# Patient Record
Sex: Female | Born: 1975 | Race: White | Hispanic: No | Smoking: Current every day smoker
Health system: Southern US, Community
[De-identification: ages and names within clinical notes are randomized; demographics above are authoritative.]

## PROBLEM LIST (undated history)

## (undated) ENCOUNTER — Ambulatory Visit (HOSPITAL_COMMUNITY): Admission: EM | Payer: Medicaid - Out of State | Source: Home / Self Care

## (undated) DIAGNOSIS — J45909 Unspecified asthma, uncomplicated: Secondary | ICD-10-CM

## (undated) DIAGNOSIS — E119 Type 2 diabetes mellitus without complications: Secondary | ICD-10-CM

## (undated) HISTORY — PX: ABDOMINAL HYSTERECTOMY: SHX81

## (undated) HISTORY — PX: CHOLECYSTECTOMY: SHX55

---

## 1997-07-24 ENCOUNTER — Emergency Department (HOSPITAL_COMMUNITY): Admission: EM | Admit: 1997-07-24 | Discharge: 1997-07-24 | Payer: Self-pay | Admitting: Emergency Medicine

## 1997-10-27 ENCOUNTER — Emergency Department (HOSPITAL_COMMUNITY): Admission: EM | Admit: 1997-10-27 | Discharge: 1997-10-27 | Payer: Self-pay | Admitting: Emergency Medicine

## 1998-04-06 ENCOUNTER — Emergency Department (HOSPITAL_COMMUNITY): Admission: EM | Admit: 1998-04-06 | Discharge: 1998-04-06 | Payer: Self-pay | Admitting: Emergency Medicine

## 1998-06-27 ENCOUNTER — Emergency Department (HOSPITAL_COMMUNITY): Admission: EM | Admit: 1998-06-27 | Discharge: 1998-06-27 | Payer: Self-pay | Admitting: Emergency Medicine

## 1999-12-16 ENCOUNTER — Ambulatory Visit (HOSPITAL_BASED_OUTPATIENT_CLINIC_OR_DEPARTMENT_OTHER): Admission: RE | Admit: 1999-12-16 | Discharge: 1999-12-16 | Payer: Self-pay | Admitting: *Deleted

## 1999-12-24 ENCOUNTER — Emergency Department (HOSPITAL_COMMUNITY): Admission: EM | Admit: 1999-12-24 | Discharge: 1999-12-24 | Payer: Self-pay | Admitting: *Deleted

## 2000-03-09 ENCOUNTER — Emergency Department (HOSPITAL_COMMUNITY): Admission: EM | Admit: 2000-03-09 | Discharge: 2000-03-09 | Payer: Self-pay | Admitting: Emergency Medicine

## 2001-02-13 ENCOUNTER — Emergency Department (HOSPITAL_COMMUNITY): Admission: EM | Admit: 2001-02-13 | Discharge: 2001-02-13 | Payer: Self-pay | Admitting: Emergency Medicine

## 2001-02-13 ENCOUNTER — Encounter: Payer: Self-pay | Admitting: Emergency Medicine

## 2001-03-08 ENCOUNTER — Other Ambulatory Visit: Admission: RE | Admit: 2001-03-08 | Discharge: 2001-03-08 | Payer: Self-pay | Admitting: Obstetrics and Gynecology

## 2001-04-12 ENCOUNTER — Inpatient Hospital Stay (HOSPITAL_COMMUNITY): Admission: AD | Admit: 2001-04-12 | Discharge: 2001-04-12 | Payer: Self-pay | Admitting: Obstetrics and Gynecology

## 2001-05-04 ENCOUNTER — Encounter: Payer: Self-pay | Admitting: Obstetrics and Gynecology

## 2001-05-04 ENCOUNTER — Ambulatory Visit (HOSPITAL_COMMUNITY): Admission: RE | Admit: 2001-05-04 | Discharge: 2001-05-04 | Payer: Self-pay | Admitting: Obstetrics and Gynecology

## 2001-10-06 ENCOUNTER — Inpatient Hospital Stay (HOSPITAL_COMMUNITY): Admission: AD | Admit: 2001-10-06 | Discharge: 2001-10-06 | Payer: Self-pay | Admitting: Obstetrics and Gynecology

## 2001-10-07 ENCOUNTER — Inpatient Hospital Stay (HOSPITAL_COMMUNITY): Admission: AD | Admit: 2001-10-07 | Discharge: 2001-10-09 | Payer: Self-pay | Admitting: Obstetrics and Gynecology

## 2002-01-08 ENCOUNTER — Emergency Department (HOSPITAL_COMMUNITY): Admission: EM | Admit: 2002-01-08 | Discharge: 2002-01-08 | Payer: Self-pay | Admitting: Emergency Medicine

## 2002-04-02 ENCOUNTER — Encounter (INDEPENDENT_AMBULATORY_CARE_PROVIDER_SITE_OTHER): Payer: Self-pay | Admitting: *Deleted

## 2002-04-02 LAB — CONVERTED CEMR LAB

## 2002-04-13 ENCOUNTER — Other Ambulatory Visit: Admission: RE | Admit: 2002-04-13 | Discharge: 2002-04-13 | Payer: Self-pay | Admitting: Obstetrics and Gynecology

## 2002-04-23 ENCOUNTER — Encounter: Admission: RE | Admit: 2002-04-23 | Discharge: 2002-04-23 | Payer: Self-pay | Admitting: Family Medicine

## 2002-10-14 ENCOUNTER — Emergency Department (HOSPITAL_COMMUNITY): Admission: EM | Admit: 2002-10-14 | Discharge: 2002-10-14 | Payer: Self-pay | Admitting: Emergency Medicine

## 2002-11-19 ENCOUNTER — Encounter: Admission: RE | Admit: 2002-11-19 | Discharge: 2002-11-19 | Payer: Self-pay | Admitting: Family Medicine

## 2003-11-15 ENCOUNTER — Other Ambulatory Visit: Admission: RE | Admit: 2003-11-15 | Discharge: 2003-11-15 | Payer: Self-pay | Admitting: Advanced Practice Midwife

## 2003-11-20 ENCOUNTER — Ambulatory Visit: Payer: Self-pay | Admitting: Family Medicine

## 2004-01-03 ENCOUNTER — Ambulatory Visit: Payer: Self-pay | Admitting: Family Medicine

## 2004-03-17 ENCOUNTER — Ambulatory Visit: Payer: Self-pay | Admitting: Family Medicine

## 2005-05-06 ENCOUNTER — Emergency Department (HOSPITAL_COMMUNITY): Admission: EM | Admit: 2005-05-06 | Discharge: 2005-05-06 | Payer: Self-pay | Admitting: Emergency Medicine

## 2005-05-15 ENCOUNTER — Ambulatory Visit (HOSPITAL_COMMUNITY): Admission: RE | Admit: 2005-05-15 | Discharge: 2005-05-15 | Payer: Self-pay | Admitting: Chiropractic Medicine

## 2006-03-31 DIAGNOSIS — J309 Allergic rhinitis, unspecified: Secondary | ICD-10-CM | POA: Insufficient documentation

## 2006-04-01 ENCOUNTER — Encounter (INDEPENDENT_AMBULATORY_CARE_PROVIDER_SITE_OTHER): Payer: Self-pay | Admitting: *Deleted

## 2015-11-15 ENCOUNTER — Encounter (HOSPITAL_COMMUNITY): Payer: Self-pay | Admitting: *Deleted

## 2015-11-15 ENCOUNTER — Ambulatory Visit (HOSPITAL_COMMUNITY)
Admission: EM | Admit: 2015-11-15 | Discharge: 2015-11-15 | Disposition: A | Discharge status: Home / Self Care | Payer: Medicaid - Out of State | Attending: Internal Medicine | Admitting: Internal Medicine

## 2015-11-15 DIAGNOSIS — R300 Dysuria: Secondary | ICD-10-CM | POA: Diagnosis not present

## 2015-11-15 DIAGNOSIS — J45909 Unspecified asthma, uncomplicated: Secondary | ICD-10-CM | POA: Diagnosis not present

## 2015-11-15 DIAGNOSIS — N898 Other specified noninflammatory disorders of vagina: Secondary | ICD-10-CM

## 2015-11-15 DIAGNOSIS — F17200 Nicotine dependence, unspecified, uncomplicated: Secondary | ICD-10-CM | POA: Insufficient documentation

## 2015-11-15 DIAGNOSIS — E119 Type 2 diabetes mellitus without complications: Secondary | ICD-10-CM | POA: Diagnosis not present

## 2015-11-15 HISTORY — DX: Unspecified asthma, uncomplicated: J45.909

## 2015-11-15 HISTORY — DX: Type 2 diabetes mellitus without complications: E11.9

## 2015-11-15 LAB — POCT URINALYSIS DIP (DEVICE)
BILIRUBIN URINE: NEGATIVE
GLUCOSE, UA: NEGATIVE mg/dL
Hgb urine dipstick: NEGATIVE
KETONES UR: NEGATIVE mg/dL
LEUKOCYTES UA: NEGATIVE
Nitrite: NEGATIVE
Protein, ur: NEGATIVE mg/dL
SPECIFIC GRAVITY, URINE: 1.02 (ref 1.005–1.030)
UROBILINOGEN UA: 0.2 mg/dL (ref 0.0–1.0)
pH: 7.5 (ref 5.0–8.0)

## 2015-11-15 MED ORDER — FLUCONAZOLE 150 MG PO TABS: 150.0000 mg | ORAL_TABLET | Freq: Every day | ORAL | 0 refills | Status: DC

## 2015-11-15 NOTE — ED Provider Notes (Signed)
CSN: [id number]     Arrival date & time 11/15/15  1553 History   First MD Initiated Contact with Patient 11/15/15 1716     Chief Complaint  Patient presents with  . Vaginitis   (Consider location/radiation/quality/duration/timing/severity/associated sxs/prior Treatment) Patient is a 40 y.o female, presents today for vaginal irritation, vaginal discharge and vaginal burning sensation for 3 days accompany by dysuria. She had UTI  2.5 weeks ago and was treated with 10-day course of antibiotic followed by Diflucan. She said she felt better and was feeling perfectly fine until 3 days ago when her vaginal discharge started. Patient reports her discharge to be light yellow color with no odor. She reports dysuria and frequency. She denies flank pain, abdominal pain or nausea. She is not sexually active, reports no chance of STI.       Past Medical History:  Diagnosis Date  . Asthma   . Diabetes mellitus without complication (HCC)    History reviewed. No pertinent surgical history. Family History  Problem Relation Age of Onset  . Asthma Mother   . Hypertension Father   . Diabetes Father    Social History  Substance Use Topics  . Smoking status: Current Every Day Smoker  . Smokeless tobacco: Never Used  . Alcohol use Yes   OB History    No data available     Review of Systems  Constitutional: Negative for chills, fatigue and fever.  Respiratory: Negative for cough and shortness of breath.   Cardiovascular: Negative for chest pain and palpitations.  Gastrointestinal: Negative for abdominal pain, nausea and vomiting.  Genitourinary: Positive for dysuria, frequency and vaginal discharge. Negative for flank pain and urgency.  Neurological: Negative for dizziness and headaches.    Allergies  Review of patient's allergies indicates no known allergies.  Home Medications   Prior to Admission medications   Not on File   Meds Ordered and Administered this Visit  Medications - No  data to display  BP 110/61 (BP Location: Left Arm)   Pulse 75   Temp 98.6 F (37 C) (Oral)   Resp 12   LMP 10/25/2015   SpO2 100%  No data found.   Physical Exam  Constitutional: She is oriented to person, place, and time. She appears well-developed and well-nourished.  HENT:  Head: Normocephalic and atraumatic.  Cardiovascular: Normal rate, regular rhythm and normal heart sounds.   Pulmonary/Chest: Effort normal and breath sounds normal.  Abdominal: Soft. Bowel sounds are normal. She exhibits no distension.  Suprapubic tenderness represent  Genitourinary:  Genitourinary Comments: Labia majora and minora unremarkable. Vaginal canal has moderate amt of white thick discharge, otherwise is pink with no lesions. Cervix midline, sitting low (Stage 1 uterine prolapse), firm and non-tender. Also has -adnexal tenderness, and -uterine tenderness  Neurological: She is alert and oriented to person, place, and time.  Nursing note and vitals reviewed.   Urgent Care Course   Clinical Course    Procedures (including critical care time)  Labs Review Labs Reviewed  URINE CULTURE  POCT URINALYSIS DIP (DEVICE)  CERVICOVAGINAL ANCILLARY ONLY    Imaging Review No results found.   MDM   1. Vaginal discharge    1) UA negative, however still send specimen off for urine culture to confirm 2) Vag discharge noted on exam that is white and thick, has high suspicion for yeast infection, rx for diflucan given. Take 150 mg once now, repeat in 3 days if still symptomatic.  3) Follow up with PCP as scheduled.  Return as needed 4) All questions answered. Discharge paperwork given.     Lucia EstelleFeng Herve Haug, NP 11/15/15 1742

## 2015-11-15 NOTE — ED Triage Notes (Signed)
Pt  Reports   Symptoms  Of  Vaginal     Irritation   And     A     Vaginal  Discharge      X  3  Days          Pt  Reports    A burning  Sensation  As   Well

## 2015-11-17 LAB — URINE CULTURE

## 2015-11-18 LAB — CERVICOVAGINAL ANCILLARY ONLY: Wet Prep (BD Affirm): NEGATIVE

## 2015-11-19 ENCOUNTER — Telehealth (HOSPITAL_COMMUNITY): Payer: Self-pay | Admitting: Emergency Medicine

## 2015-11-19 NOTE — Telephone Encounter (Signed)
Called pt and notified of recent lab results  Pt ID'd properly... Reports feeling better and sx have subsided Was able to finish and tolerate well Diflucan  Adv pt if sx are not getting better to return or to f/u w/PCP Pt verb understanding.

## 2015-11-19 NOTE — Telephone Encounter (Signed)
-----   Message from Eustace MooreLaura W Murray, MD sent at 11/19/2015  8:15 AM EDT ----- Please let patient know that urine culture was positive for lactobacillus, which is part of the normal urogenital flora and does not require treatment.  Recheck for further evaluation if symptoms persist.  Ria ClockLaura Murray MD

## 2021-05-27 ENCOUNTER — Encounter (HOSPITAL_COMMUNITY): Payer: Self-pay

## 2021-05-27 ENCOUNTER — Ambulatory Visit (INDEPENDENT_AMBULATORY_CARE_PROVIDER_SITE_OTHER): Payer: Self-pay

## 2021-05-27 ENCOUNTER — Ambulatory Visit (HOSPITAL_COMMUNITY)
Admission: EM | Admit: 2021-05-27 | Discharge: 2021-05-27 | Disposition: A | Discharge status: Home / Self Care | Payer: Self-pay | Attending: Family Medicine | Admitting: Family Medicine

## 2021-05-27 DIAGNOSIS — M545 Low back pain, unspecified: Secondary | ICD-10-CM

## 2021-05-27 DIAGNOSIS — M25561 Pain in right knee: Secondary | ICD-10-CM

## 2021-05-27 DIAGNOSIS — M79642 Pain in left hand: Secondary | ICD-10-CM

## 2021-05-27 MED ORDER — DEXAMETHASONE SODIUM PHOSPHATE 10 MG/ML IJ SOLN
INTRAMUSCULAR | Status: AC
  Filled 2021-05-27: qty 1

## 2021-05-27 MED ORDER — DEXAMETHASONE SODIUM PHOSPHATE 10 MG/ML IJ SOLN
10.0000 mg | Freq: Once | INTRAMUSCULAR | Status: AC
  Administered 2021-05-27: 10 mg via INTRAMUSCULAR

## 2021-05-27 NOTE — ED Triage Notes (Signed)
Pt states she fell one week ago c/o pain to rt lower back, rt knee and l hand. States she has been taking aleve and Advil with no relief. ?

## 2021-05-28 NOTE — ED Provider Notes (Signed)
?[facility] ? ? ?[id number] ?05/27/21 Arrival Time: 1639 ? ?ASSESSMENT & PLAN: ? ?1. Acute midline low back pain without sciatica   ?2. Left hand pain   ?3. Acute pain of right knee   ? ? ?I have personally viewed the imaging studies ordered this visit. ?Negative imaging of lumbar spine, R knee, L hand; no fractures appreciated. ? ?Discussed MSK related pain/soreness. Reports steroids have helped with similar pains in the past. ? ?Meds ordered this encounter  ?Medications  ? dexamethasone (DECADRON) injection 10 mg  ? ?WBAT. ? ?Orders Placed This Encounter  ?Procedures  ? DG Hand Complete Left  ? DG Knee Complete 4 Views Right  ? DG Lumbar Spine Complete  ? ? ?Recommend: ? Follow-up Information   ? ? [facility].   ?Why: If worsening or failing to improve as anticipated. ?Contact information: ?[address] ?[place] [address] ?[phone] ? ?  ?  ? ? Dominica Severin, MD.   ?Specialty: Orthopedic Surgery ?Why: If worsening or failing to improve as anticipated. ?Contact information: ?[address] ?[address] ?[place] [address] ?[phone] ? ? ?  ?  ? ?  ?  ? ?  ? ? ?Reviewed expectations re: course of current medical issues. Questions answered. ?Outlined signs and symptoms indicating need for more acute intervention. ?Patient verbalized understanding. ?After Visit Summary given. ? ?SUBJECTIVE: ?History from: patient. ?Destiny Richardson is a 46 y.o. female who reports fall; one week ago; with continued lumbar back pain, R knee pain, and L hand pain, esp when trying to grip objects. OTC analgesics with mild help. No extremity sensation changes or weakness. Normal bowel/bladder habits. Ambulatory. "Just still really sore". ? ?History reviewed. No pertinent surgical history.  ? ? ?OBJECTIVE: ? ?Vitals:  ? 05/27/21 1715  ?BP: (!) 135/91  ?Pulse: 85  ?Resp: 18  ?Temp: 98.7 ?F (37.1 ?C)  ?TempSrc: Oral  ?SpO2: 100%  ?  ?General appearance: alert; no  distress ?HEENT: [place]; AT ?Neck: supple with FROM ?Resp: unlabored respirations ?Extremities: ?RLE: warm with well perfused appearance; poorly localized moderate tenderness over right anterior/medial knee; without gross deformities; swelling: none; bruising: none; knee ROM: normal ?LUE: vague tenderness over thenar hand; normal ROM; pain reported when gripping ?CV: brisk extremity capillary refill of all extremities; 2+ radial pulse and DP pulse of LUE and RLE respectively. ?Skin: warm and dry; no visible rashes ?Neurologic: gait normal; normal sensation and strength of all extremities ?Psychological: alert and cooperative; normal mood and affect ? ?Imaging: ?DG Lumbar Spine Complete ? ?Result Date: 05/27/2021 ?CLINICAL DATA:  Recent fall, back pain EXAM: LUMBAR SPINE - COMPLETE 4+ VIEW COMPARISON:  05/06/2005 FINDINGS: There is no evidence of lumbar spine fracture. Alignment is normal. Intervertebral disc spaces are maintained. IMPRESSION: No acute abnormality or significant degenerative change. Electronically Signed   By: Judie Petit.  Shick M.D.   On: 05/27/2021 18:40  ? ?DG Knee Complete 4 Views Right ? ?Result Date: 05/27/2021 ?CLINICAL DATA:  Recent fall, pain EXAM: RIGHT KNEE - COMPLETE 4+ VIEW COMPARISON:  None. FINDINGS: No evidence of fracture, dislocation, or joint effusion. No evidence of arthropathy or other focal bone abnormality. Soft tissues are unremarkable. IMPRESSION: Negative. Electronically Signed   By: Judie Petit.  Shick M.D.   On: 05/27/2021 18:38  ? ?DG Hand Complete Left ? ?Result Date: 05/27/2021 ?CLINICAL DATA:  Recent fall, left thumb and middle finger pain EXAM: LEFT HAND - COMPLETE 3+ VIEW COMPARISON:  None. FINDINGS: There  is no evidence of fracture or dislocation. There is no evidence of arthropathy or other focal bone abnormality. Soft tissues are unremarkable. IMPRESSION: Negative. Electronically Signed   By: Judie Petit.  Shick M.D.   On: 05/27/2021 18:39   ? ? ? ?No Known Allergies ? ?Past Medical History:   ?Diagnosis Date  ? Asthma   ? Diabetes mellitus without complication (HCC)   ? ?Social History  ? ?Socioeconomic History  ? Marital status: Legally Separated  ?  Spouse name: Not on file  ? Number of children: Not on file  ? Years of education: Not on file  ? Highest education level: Not on file  ?Occupational History  ? Not on file  ?Tobacco Use  ? Smoking status: Every Day  ? Smokeless tobacco: Never  ?Substance and Sexual Activity  ? Alcohol use: Yes  ? Drug use: Not on file  ? Sexual activity: Not on file  ?Other Topics Concern  ? Not on file  ?Social History Narrative  ? Not on file  ? ?Social Determinants of Health  ? ?Financial Resource Strain: Not on file  ?Food Insecurity: Not on file  ?Transportation Needs: Not on file  ?Physical Activity: Not on file  ?Stress: Not on file  ?Social Connections: Not on file  ? ?Family History  ?Problem Relation Age of Onset  ? Asthma Mother   ? Hypertension Father   ? Diabetes Father   ? ?History reviewed. No pertinent surgical history. ? ? ?  ?Mardella Layman, MD ?05/28/21 [phone] ? ?

## 2021-06-08 ENCOUNTER — Ambulatory Visit: Payer: Self-pay | Admitting: Family Medicine

## 2021-10-07 ENCOUNTER — Ambulatory Visit (HOSPITAL_COMMUNITY)
Admission: EM | Admit: 2021-10-07 | Discharge: 2021-10-07 | Disposition: A | Discharge status: Home / Self Care | Payer: Self-pay | Attending: Family | Admitting: Family

## 2021-10-07 ENCOUNTER — Encounter (HOSPITAL_COMMUNITY): Payer: Self-pay

## 2021-10-07 DIAGNOSIS — M25512 Pain in left shoulder: Secondary | ICD-10-CM

## 2021-10-07 DIAGNOSIS — M25511 Pain in right shoulder: Secondary | ICD-10-CM

## 2021-10-07 DIAGNOSIS — M791 Myalgia, unspecified site: Secondary | ICD-10-CM

## 2021-10-07 DIAGNOSIS — M542 Cervicalgia: Secondary | ICD-10-CM

## 2021-10-07 MED ORDER — METHYLPREDNISOLONE SODIUM SUCC 125 MG IJ SOLR
125.0000 mg | Freq: Once | INTRAMUSCULAR | Status: AC
  Administered 2021-10-07: 125 mg via INTRAMUSCULAR

## 2021-10-07 MED ORDER — CYCLOBENZAPRINE HCL 10 MG PO TABS
ORAL_TABLET | ORAL | 0 refills | Status: DC
  Filled 2021-10-07: qty 15, 5d supply, fill #0

## 2021-10-07 MED ORDER — METHYLPREDNISOLONE SODIUM SUCC 125 MG IJ SOLR
INTRAMUSCULAR | Status: AC
  Filled 2021-10-07: qty 2
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
## 2021-10-08 ENCOUNTER — Other Ambulatory Visit: Payer: Self-pay

## 2021-10-09 ENCOUNTER — Other Ambulatory Visit: Payer: Self-pay

## 2022-03-15 ENCOUNTER — Ambulatory Visit
Admission: EM | Admit: 2022-03-15 | Discharge: 2022-03-15 | Disposition: A | Discharge status: Home / Self Care | Payer: Medicaid Other | Attending: Family Medicine | Admitting: Family Medicine

## 2022-03-15 DIAGNOSIS — R6889 Other general symptoms and signs: Secondary | ICD-10-CM

## 2022-03-15 DIAGNOSIS — K760 Fatty (change of) liver, not elsewhere classified: Secondary | ICD-10-CM

## 2022-03-15 DIAGNOSIS — R42 Dizziness and giddiness: Secondary | ICD-10-CM | POA: Diagnosis not present

## 2022-03-15 NOTE — ED Triage Notes (Signed)
Pt c/o fatigue, intermittent dizziness and sores in mouth x 4-5 days. States the roof of her mouth feels raw. Says she's been under a lot of stress lately with some deaths in family.

## 2022-03-15 NOTE — Discharge Instructions (Signed)
It is important that you establish with a primary care doctor.  They will do a complete physical examination and any preventative blood work that you need (like a cholesterol) I reviewed your medical record.  The only finding during your gallbladder surgery was a fatty liver.  Your liver tests were mildly elevated.  I am repeating that blood work to make sure it is improved Make sure you are drinking plenty of water.  You look a little dehydrated on exam and this will make you lightheaded Check MyChart for your test results

## 2022-03-15 NOTE — ED Provider Notes (Signed)
[facility]    CSN: [id number] Arrival date & time: 03/15/22  1600      History   Chief Complaint Chief Complaint  Patient presents with   Dizziness   Sores in mouth    HPI Destiny Richardson is a 47 y.o. female.   HPI  Patient states that her brother and her mother both died within the last 27 months.  She has been under a lot of stress.  She had an elective cholecystectomy in December.  She was told that there might be something wrong with her liver.  This has been wearing her because her mother did die of "liver and gallbladder cancer".  She has this vague feeling that she is sick.  She states that she feels her something wrong in her abdomen.  She states she gets lightheaded at times.  She wants her cholesterol checked.  She has not had a physical in many years.  She states she does not have a primary care doctor.  Is a smoker.  Has a vague sense of soreness in her mouth and diminished taste.  She does not feel like she has a virus or is coming down with something, the symptoms are waxing and waning over time.  Past Medical History:  Diagnosis Date   Asthma    Diabetes mellitus without complication [facility])     Patient Active Problem List   Diagnosis Date Noted   RHINITIS, ALLERGIC 03/31/2006    Past Surgical History:  Procedure Laterality Date   CHOLECYSTECTOMY      OB History   No obstetric history on file.      Home Medications    Prior to Admission medications   Not on File    Family History Family History  Problem Relation Age of Onset   Asthma Mother    Hypertension Father    Diabetes Father     Social History Social History   Tobacco Use   Smoking status: Every Day   Smokeless tobacco: Never  Substance Use Topics   Alcohol use: Yes     Allergies   Patient has no known allergies.   Review of Systems Review of Systems  See HPI Physical Exam Triage Vital Signs ED Triage Vitals  Enc Vitals Group     BP 03/15/22 1648 131/81      Pulse Rate 03/15/22 1648 93     Resp 03/15/22 1648 16     Temp 03/15/22 1648 98.4 F (36.9 C)     Temp Source 03/15/22 1648 Oral     SpO2 03/15/22 1648 99 %     Weight --      Height --      Head Circumference --      Peak Flow --      Pain Score 03/15/22 1649 7     Pain Loc --      Pain Edu? --      Excl. in [place]? --    No data found.  Updated Vital Signs BP 131/81 (BP Location: Left Arm)   Pulse 93   Temp 98.4 F (36.9 C) (Oral)   Resp 16   SpO2 99%      Physical Exam Constitutional:      General: She is not in acute distress.    Appearance: She is well-developed. She is obese.     Comments: Truncal obesity  HENT:     Head: Normocephalic and atraumatic.     Nose: No congestion or rhinorrhea.  Mouth/Throat:     Mouth: Mucous membranes are dry.     Pharynx: No posterior oropharyngeal erythema.  Eyes:     Conjunctiva/sclera: Conjunctivae normal.     Pupils: Pupils are equal, round, and reactive to light.  Cardiovascular:     Rate and Rhythm: Normal rate and regular rhythm.     Heart sounds: Normal heart sounds.  Pulmonary:     Effort: Pulmonary effort is normal. No respiratory distress.     Breath sounds: Normal breath sounds.  Abdominal:     General: Bowel sounds are normal. There is no distension.     Palpations: Abdomen is soft.     Tenderness: There is no abdominal tenderness.     Comments: Well-healed laparoscopy scars.  Nontender abdomen  Musculoskeletal:        General: Normal range of motion.     Cervical back: Normal range of motion.  Lymphadenopathy:     Cervical: No cervical adenopathy.  Skin:    General: Skin is warm and dry.  Neurological:     Mental Status: She is alert.   States she has a ventral hernia.  Not appreciated   UC Treatments / Results  Labs (all labs ordered are listed, but only abnormal results are displayed) Labs Reviewed  COMPREHENSIVE METABOLIC PANEL  CBC WITH DIFFERENTIAL/PLATELET    EKG   Radiology No  results found.  Procedures Procedures (including critical care time)  Medications Ordered in UC Medications - No data to display  Initial Impression / Assessment and Plan / UC Course  I have reviewed the triage vital signs and the nursing notes.  Pertinent labs & imaging results that were available during my care of the patient were reviewed by me and considered in my medical decision making (see chart for details).     I reviewed the patient's operative note.  I reviewed her hospital notes.  It appears that she had an uncomplicated cholecystectomy for chronic cholecystitis.  She did have a fatty liver.  She had elevated liver functions and a glucose of 134.  These do need to be repeated.  She failed to keep her follow-up appointment. She needs a primary care doctor for her multiple complaints. Lightheadedness may be partially dehydration and stress.  Cardiovascular exam normal Final Clinical Impressions(s) / UC Diagnoses   Final diagnoses:  Lightheadedness  Fatty liver  Feeling unwell     Discharge Instructions      It is important that you establish with a primary care doctor.  They will do a complete physical examination and any preventative blood work that you need (like a cholesterol) I reviewed your medical record.  The only finding during your gallbladder surgery was a fatty liver.  Your liver tests were mildly elevated.  I am repeating that blood work to make sure it is improved Make sure you are drinking plenty of water.  You look a little dehydrated on exam and this will make you lightheaded Check MyChart for your test results   ED Prescriptions   None    PDMP not reviewed this encounter.   Raylene Everts, MD 03/15/22 [phone]

## 2022-03-16 LAB — CBC WITH DIFFERENTIAL/PLATELET
Basophils Absolute: 0 10*3/uL (ref 0.0–0.2)
Basos: 1 %
EOS (ABSOLUTE): 0.4 10*3/uL (ref 0.0–0.4)
Eos: 6 %
Hematocrit: 39.3 % (ref 34.0–46.6)
Hemoglobin: 13.1 g/dL (ref 11.1–15.9)
Immature Grans (Abs): 0 10*3/uL (ref 0.0–0.1)
Immature Granulocytes: 0 %
Lymphocytes Absolute: 2.1 10*3/uL (ref 0.7–3.1)
Lymphs: 32 %
MCH: 30.5 pg (ref 26.6–33.0)
MCHC: 33.3 g/dL (ref 31.5–35.7)
MCV: 92 fL (ref 79–97)
Monocytes Absolute: 0.8 10*3/uL (ref 0.1–0.9)
Monocytes: 12 %
Neutrophils Absolute: 3.2 10*3/uL (ref 1.4–7.0)
Neutrophils: 49 %
Platelets: 290 10*3/uL (ref 150–450)
RBC: 4.29 x10E6/uL (ref 3.77–5.28)
RDW: 12.1 % (ref 11.7–15.4)
WBC: 6.5 10*3/uL (ref 3.4–10.8)

## 2022-03-16 LAB — COMPREHENSIVE METABOLIC PANEL
ALT: 29 IU/L (ref 0–32)
AST: 17 IU/L (ref 0–40)
Albumin/Globulin Ratio: 1.5 (ref 1.2–2.2)
Albumin: 4 g/dL (ref 3.9–4.9)
Alkaline Phosphatase: 56 IU/L (ref 44–121)
BUN/Creatinine Ratio: 22 (ref 9–23)
BUN: 13 mg/dL (ref 6–24)
Bilirubin Total: <0.2 mg/dL (ref 0.0–1.2)
CO2: 23 mmol/L (ref 20–29)
Calcium: 9.4 mg/dL (ref 8.7–10.2)
Chloride: 104 mmol/L (ref 96–106)
Creatinine, Ser: 0.6 mg/dL (ref 0.57–1.00)
Globulin, Total: 2.7 g/dL (ref 1.5–4.5)
Glucose: 110 mg/dL — ABNORMAL HIGH (ref 70–99)
Potassium: 4.3 mmol/L (ref 3.5–5.2)
Sodium: 139 mmol/L (ref 134–144)
Total Protein: 6.7 g/dL (ref 6.0–8.5)
eGFR: 111 mL/min/{1.73_m2} (ref >59)

## 2022-03-29 ENCOUNTER — Other Ambulatory Visit: Payer: Self-pay | Admitting: Obstetrics and Gynecology

## 2022-03-29 DIAGNOSIS — N6320 Unspecified lump in the left breast, unspecified quadrant: Secondary | ICD-10-CM

## 2022-04-12 ENCOUNTER — Ambulatory Visit
Admission: RE | Admit: 2022-04-12 | Discharge: 2022-04-12 | Disposition: A | Discharge status: Home / Self Care | Payer: Medicaid Other | Source: Ambulatory Visit | Attending: Obstetrics and Gynecology | Admitting: Obstetrics and Gynecology

## 2022-04-12 DIAGNOSIS — N6320 Unspecified lump in the left breast, unspecified quadrant: Secondary | ICD-10-CM

## 2022-08-26 ENCOUNTER — Encounter (HOSPITAL_BASED_OUTPATIENT_CLINIC_OR_DEPARTMENT_OTHER): Payer: Self-pay | Admitting: Emergency Medicine

## 2022-08-26 ENCOUNTER — Emergency Department (HOSPITAL_BASED_OUTPATIENT_CLINIC_OR_DEPARTMENT_OTHER): Payer: Medicaid Other

## 2022-08-26 ENCOUNTER — Other Ambulatory Visit: Payer: Self-pay

## 2022-08-26 ENCOUNTER — Emergency Department (HOSPITAL_BASED_OUTPATIENT_CLINIC_OR_DEPARTMENT_OTHER)
Admission: EM | Admit: 2022-08-26 | Discharge: 2022-08-27 | Disposition: A | Discharge status: Home / Self Care | Payer: Medicaid Other | Attending: Emergency Medicine | Admitting: Emergency Medicine

## 2022-08-26 DIAGNOSIS — R109 Unspecified abdominal pain: Secondary | ICD-10-CM | POA: Insufficient documentation

## 2022-08-26 DIAGNOSIS — E119 Type 2 diabetes mellitus without complications: Secondary | ICD-10-CM | POA: Diagnosis not present

## 2022-08-26 DIAGNOSIS — J45909 Unspecified asthma, uncomplicated: Secondary | ICD-10-CM | POA: Diagnosis not present

## 2022-08-26 DIAGNOSIS — R300 Dysuria: Secondary | ICD-10-CM | POA: Insufficient documentation

## 2022-08-26 LAB — URINALYSIS, ROUTINE W REFLEX MICROSCOPIC
Bilirubin Urine: NEGATIVE
Glucose, UA: NEGATIVE mg/dL
Hgb urine dipstick: NEGATIVE
Ketones, ur: NEGATIVE mg/dL
Leukocytes,Ua: NEGATIVE
Nitrite: NEGATIVE
Protein, ur: NEGATIVE mg/dL
Specific Gravity, Urine: 1.024 (ref 1.005–1.030)
pH: 6 (ref 5.0–8.0)

## 2022-08-26 LAB — COMPREHENSIVE METABOLIC PANEL
ALT: 23 U/L (ref 0–44)
AST: 17 U/L (ref 15–41)
Albumin: 4.1 g/dL (ref 3.5–5.0)
Alkaline Phosphatase: 44 U/L (ref 38–126)
Anion gap: 5 (ref 5–15)
BUN: 13 mg/dL (ref 6–20)
CO2: 31 mmol/L (ref 22–32)
Calcium: 9.1 mg/dL (ref 8.9–10.3)
Chloride: 101 mmol/L (ref 98–111)
Creatinine, Ser: 0.67 mg/dL (ref 0.44–1.00)
GFR, Estimated: >60 mL/min (ref >60)
Glucose, Bld: 88 mg/dL (ref 70–99)
Potassium: 4.1 mmol/L (ref 3.5–5.1)
Sodium: 137 mmol/L (ref 135–145)
Total Bilirubin: 0.3 mg/dL (ref 0.3–1.2)
Total Protein: 7.3 g/dL (ref 6.5–8.1)

## 2022-08-26 LAB — CBC
HCT: 40.4 % (ref 36.0–46.0)
Hemoglobin: 13.3 g/dL (ref 12.0–15.0)
MCH: 30.2 pg (ref 26.0–34.0)
MCHC: 32.9 g/dL (ref 30.0–36.0)
MCV: 91.6 fL (ref 80.0–100.0)
Platelets: 305 10*3/uL (ref 150–400)
RBC: 4.41 MIL/uL (ref 3.87–5.11)
RDW: 12.4 % (ref 11.5–15.5)
WBC: 8.2 10*3/uL (ref 4.0–10.5)
nRBC: 0 % (ref 0.0–0.2)

## 2022-08-26 LAB — LIPASE, BLOOD: Lipase: 28 U/L (ref 11–51)

## 2022-08-26 MED ORDER — FLUCONAZOLE 150 MG PO TABS
150.0000 mg | ORAL_TABLET | Freq: Once | ORAL | Status: AC
  Administered 2022-08-26: 150 mg via ORAL
  Filled 2022-08-26: qty 1

## 2022-08-26 NOTE — ED Notes (Signed)
Unable to obtain a urine sample, will ask again once the pt is in the exam room.

## 2022-08-26 NOTE — ED Provider Notes (Signed)
[place] EMERGENCY DEPARTMENT AT [facility] Provider Note   CSN: [id number] Arrival date & time: 08/26/22  1853     History  Chief Complaint  Patient presents with   Dysuria    Destiny Richardson is a 47 y.o. female with medical history of asthma, diabetes.  Patient presents to ED for evaluation of dysuria, right-sided flank pain.  Patient reports that for the last 5 days she has had burning with urination, abnormal smell to her urine associated with right-sided flank pain.  She is also endorsing nausea without vomiting that will occur intermittently over the last 5 days.  She states that she drinks a lot of sweet tea and sweets.  She denies a history of kidney stones.  She believes that she might have a UTI.  She denies fevers, abdominal pain, vaginal discharge, chest pain, shortness of breath.  States that her right-sided flank pain is waxed and waned over the last 5 days and currently a 2 out of 10.   Dysuria Associated symptoms: flank pain   Associated symptoms: no abdominal pain, no fever, no nausea, no vaginal discharge and no vomiting        Home Medications Prior to Admission medications   Not on File      Allergies    Patient has no known allergies.    Review of Systems   Review of Systems  Constitutional:  Negative for fever.  Respiratory:  Negative for shortness of breath.   Cardiovascular:  Negative for chest pain.  Gastrointestinal:  Negative for abdominal pain, nausea and vomiting.  Genitourinary:  Positive for dysuria and flank pain. Negative for vaginal discharge.  All other systems reviewed and are negative.   Physical Exam Updated Vital Signs BP 130/88 (BP Location: Right Arm)   Pulse 92   Temp 98.4 F (36.9 C)   Resp 16   SpO2 98%  Physical Exam Vitals and nursing note reviewed.  Constitutional:      General: She is not in acute distress.    Appearance: Normal appearance. She is not ill-appearing, toxic-appearing or diaphoretic.  HENT:      Head: Normocephalic and atraumatic.     Nose: Nose normal.     Mouth/Throat:     Mouth: Mucous membranes are moist.     Pharynx: Oropharynx is clear.  Eyes:     Extraocular Movements: Extraocular movements intact.     Conjunctiva/sclera: Conjunctivae normal.     Pupils: Pupils are equal, round, and reactive to light.  Cardiovascular:     Rate and Rhythm: Normal rate and regular rhythm.  Pulmonary:     Effort: Pulmonary effort is normal.     Breath sounds: Normal breath sounds. No wheezing.  Abdominal:     General: Abdomen is flat. Bowel sounds are normal.     Palpations: Abdomen is soft.     Tenderness: There is no abdominal tenderness. There is no guarding.  Musculoskeletal:     Cervical back: Normal range of motion and neck supple. No tenderness.  Skin:    General: Skin is warm and dry.     Capillary Refill: Capillary refill takes less than 2 seconds.  Neurological:     Mental Status: She is alert and oriented to person, place, and time.     ED Results / Procedures / Treatments   Labs (all labs ordered are listed, but only abnormal results are displayed) Labs Reviewed  URINE CULTURE  URINALYSIS, ROUTINE W REFLEX MICROSCOPIC  CBC  COMPREHENSIVE  METABOLIC PANEL  LIPASE, BLOOD    EKG None  Radiology CT Renal Stone Study  Result Date: 08/26/2022 CLINICAL DATA:  Right flank pain EXAM: CT ABDOMEN AND PELVIS WITHOUT CONTRAST TECHNIQUE: Multidetector CT imaging of the abdomen and pelvis was performed following the standard protocol without IV contrast. RADIATION DOSE REDUCTION: This exam was performed according to the departmental dose-optimization program which includes automated exposure control, adjustment of the mA and/or kV according to patient size and/or use of iterative reconstruction technique. COMPARISON:  None Available. FINDINGS: Lower chest: No acute abnormality. Hepatobiliary: No focal liver abnormality is seen. Status post cholecystectomy. No biliary  dilatation. Pancreas: Unremarkable. No pancreatic ductal dilatation or surrounding inflammatory changes. Spleen: Normal in size without focal abnormality. Adrenals/Urinary Tract: There is a 3 mm calculus in the left kidney. Otherwise, the adrenal glands, kidneys and bladder are within normal limits. Stomach/Bowel: Stomach is within normal limits. Appendix appears normal. No evidence of bowel wall thickening, distention, or inflammatory changes. Vascular/Lymphatic: No significant vascular findings are present. No enlarged abdominal or pelvic lymph nodes. Reproductive: Status post hysterectomy. No adnexal masses. Other: No abdominal wall hernia or abnormality. No abdominopelvic ascites. Musculoskeletal: No acute or significant osseous findings. IMPRESSION: 1. Nonobstructing left renal calculus. 2. No acute localizing process in the abdomen or pelvis. Electronically Signed   By: Darliss Cheney M.D.   On: 08/26/2022 22:39    Procedures Procedures   Medications Ordered in ED Medications  fluconazole (DIFLUCAN) tablet 150 mg (has no administration in time range)    ED Course/ Medical Decision Making/ A&P    Medical Decision Making Amount and/or Complexity of Data Reviewed Labs: ordered. Radiology: ordered.   47 year old female presents to the ED for evaluation.  Please see HPI for further details.  On examination the patient is afebrile and nontachycardic.  Lung sounds are clear bilaterally, she is not hypoxic.  Her abdomen is soft and compressible throughout.  She has no CVA tenderness bilaterally.  She is overall nontoxic in appearance.  CBC shows no leukocytosis or anemia.  The patient metabolic panel shows no electrolyte derangement, no elevated creatinine, no elevated LFTs, no anion gap elevation.  The patient lipase WNL.  Patient urinalysis is negative for all.  Patient CT renal stone study is negative for all.  Patient does have a nonobstructing 3 mm stone in her left kidney however this  should not be causing symptoms.  Discussed results with the patient.  I have advised the patient that we will culture her urine and that if it grows bacteria she will be placed antibiotics tomorrow.  The patient is wondering whether or not her symptoms are secondary to a yeast infection.  She denies any vaginal discharge but she would like to trial a one-time dose of fluconazole 150 mg to see if this helps her symptoms.  This seems reasonable so I have ordered her this medication and she was given it before discharge.  I have advised the patient that she will need to follow-up with her PCP for reevaluation and she has voiced understanding.  She had all of her questions answered to her satisfaction.  She was given strict return precautions and she voiced understanding.  She is stable to discharge home at this time.   Final Clinical Impression(s) / ED Diagnoses Final diagnoses:  Dysuria    Rx / DC Orders ED Discharge Orders     None         Al Decant, PA-C 08/26/22 2329  Rexford Maus, DO 08/26/22 2334

## 2022-08-26 NOTE — Discharge Instructions (Signed)
It was a pleasure taking part in your care today.  As we discussed, your urinalysis is unremarkable and did not show any signs of infection.  Your CT scan did not show any evidence of kidney stones that should be causing symptoms.  We have cultured your urine and if it grows bacteria you will be called in the morning and placed on antibiotics.  I have given you a one-time dose of fluconazole for yeast infection at your request.  I would like for you to follow-up with your PCP for reevaluation at your earliest convenience.  I have also attached guide concerning dysuria would like for you to follow-up and read through them at your earliest convenience.  Please return to the ED with any new or worsening signs or symptoms for further care.

## 2022-08-26 NOTE — ED Notes (Signed)
Patient to CT via w/c

## 2022-08-26 NOTE — ED Triage Notes (Signed)
Pt presents to ED POV. Pt c/o pain w/ urination and R flank pain x5d. Denies hx of UTI and/or kidney stones

## 2023-02-10 ENCOUNTER — Other Ambulatory Visit: Payer: Self-pay | Admitting: Obstetrics and Gynecology

## 2023-02-10 DIAGNOSIS — Z1231 Encounter for screening mammogram for malignant neoplasm of breast: Secondary | ICD-10-CM

## 2023-05-03 ENCOUNTER — Ambulatory Visit

## 2023-05-06 ENCOUNTER — Ambulatory Visit
Admission: RE | Admit: 2023-05-06 | Discharge: 2023-05-06 | Disposition: A | Discharge status: Home / Self Care | Source: Ambulatory Visit | Attending: Obstetrics and Gynecology | Admitting: Obstetrics and Gynecology

## 2023-05-06 DIAGNOSIS — Z1231 Encounter for screening mammogram for malignant neoplasm of breast: Secondary | ICD-10-CM

## 2023-09-13 ENCOUNTER — Other Ambulatory Visit (INDEPENDENT_AMBULATORY_CARE_PROVIDER_SITE_OTHER): Payer: Self-pay

## 2023-09-13 ENCOUNTER — Ambulatory Visit: Admitting: Orthopedic Surgery

## 2023-09-13 DIAGNOSIS — M79641 Pain in right hand: Secondary | ICD-10-CM

## 2023-09-13 DIAGNOSIS — M180 Bilateral primary osteoarthritis of first carpometacarpal joints: Secondary | ICD-10-CM

## 2023-09-13 DIAGNOSIS — M65312 Trigger thumb, left thumb: Secondary | ICD-10-CM

## 2023-09-13 DIAGNOSIS — M79642 Pain in left hand: Secondary | ICD-10-CM

## 2023-09-13 MED ORDER — BETAMETHASONE SOD PHOS & ACET 6 (3-3) MG/ML IJ SUSP
6.0000 mg | INTRAMUSCULAR | Status: AC | PRN
  Administered 2023-09-13 (×2): 6 mg via INTRA_ARTICULAR

## 2023-09-13 MED ORDER — LIDOCAINE HCL 1 % IJ SOLN
1.0000 mL | INTRAMUSCULAR | Status: AC | PRN
  Administered 2023-09-13 (×2): 1 mL

## 2023-09-13 NOTE — Progress Notes (Signed)
 Destiny Richardson - 48 y.o. female MRN [id number]  Date of birth: 06-12-1975  Office Visit Note: Visit Date: 09/13/2023 PCP: Patient, No Pcp Per Referred by: Orion Kerns, MD  Subjective: No chief complaint on file.  HPI: Destiny Richardson is a pleasant 48 y.o. female who presents today for evaluation of bilateral hand pain, left greater than right.  Symptoms have been progressive over the past multiple months, worsening in nature.  She has isolated significant pain to the basilar aspects of the thumbs, is also describing significant pain along the volar base of the left thumb.  She was given a prednisone Dosepak by her PCP approximately a month ago with mild but transient relief.  No other treatments have been tried.  Pertinent ROS were reviewed with the patient and found to be negative unless otherwise specified above in HPI.   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 Trigger thumb, left thumb   2. Pain in left hand   3. Pain in right hand   4. Arthritis of carpometacarpal (CMC) joint of both thumbs     Plan: Based on clinical examination today, patient has signs and symptoms consistent with mild, early stage thumb CMC arthritis.  She is also demonstrating signs and symptoms consistent with left thumb trigger digit.  Extensive discussion was had with the patient today regarding her left thumb trigger digit.  We discussed the etiology and pathophysiology of stenosing tenosynovitis.  We discussed conservative versus surgical treatment modalities.  From a conservative standpoint, we discussed activity modification, splinting,  therapy and injections.  From a surgical standpoint, we discussed the possibility for trigger digit release as well as all risk and benefits associated.  Given that she has not trialed conservative treatments outside of prednisone, patient is appropriate candidate for cortisone injection to the left thumb A1 pulley for symptom relief.  Risks and benefit of the cortisone injection were discussed in detail, patient agreed to proceed.  Injection was provided today without issue, patient will return in approximate 6 weeks time for a recheck.   Follow-up: No follow-ups on file.   Meds & Orders: No orders of the defined types were placed in this encounter.   Orders Placed This Encounter  Procedures   XR Hand Complete Right   XR Hand Complete Left     Procedures: Hand/UE Inj: L thumb A1 for trigger finger on 09/13/2023 7:55 PM Indications: pain Details: 25 G needle, volar approach Medications: 1 mL lidocaine  1 %; 6 mg betamethasone  acetate-betamethasone  sodium phosphate  6 (3-3) MG/ML Outcome: tolerated well, no immediate complications Procedure, treatment alternatives, risks and benefits explained, specific risks discussed. Consent was given by the patient. Patient was prepped and draped in the usual sterile fashion.          Clinical History: No specialty comments available.  She reports that she has been smoking. She has never used smokeless tobacco. No results for input(s): HGBA1C, LABURIC in the last 8760 hours.  Objective:   Vital Signs: There were no vitals taken for this visit.  Physical Exam  Gen: Well-appearing, in no acute distress; non-toxic CV: Regular Rate. Well-perfused. Warm.  Resp: Breathing unlabored on room air; no wheezing. Psych: Fluid speech in  conversation; appropriate affect; normal thought process  Ortho Exam PHYSICAL EXAM:  General: Patient is well appearing and in no distress.  Skin and Muscle: No significant skin changes are apparent to hands.   Muscle bulk and contour normal, no signs of atrophy.     Range of Motion and Palpation Tests: Mobility is full about the elbows with flexion and extension.  Forearm supination and pronation are 85/85 bilaterally.  Wrist flexion/extension is 75/65 bilaterally.  Digital flexion and extension are full.  Thumb opposition is full to the base of the small fingers bilaterally.    Palpable nodule left thumb A1 pulley with associated tenderness  Moderate tenderness over the thumb CMC articulations is observed.  Scaphoid shift test is negative bilateral.  Finklestein test is negative bilateral.  Ulnar impingement test is bilateral.  No evidence of radiocarpal, midcarpal or intercarpal joint instability with provocation.  Neurologic, Vascular, Motor: Sensation is intact to light touch in the median/radial/ulnar distributions. Fingers pink and well perfused.  Capillary refill is brisk.      No results found for: HGBA1C   Imaging: XR Hand Complete Right Result Date: 09/13/2023 There is no evidence of fracture or dislocation. There is no evidence of arthropathy or other focal bone abnormality. Soft tissues are unremarkable.   XR Hand Complete Left Result Date: 09/13/2023 There is no evidence of fracture or dislocation. There is no evidence of arthropathy or other focal bone abnormality. Soft tissues are unremarkable.    Past Medical/Family/Surgical/Social History: Medications & Allergies reviewed per EMR, new medications updated. Patient Active Problem List   Diagnosis Date Noted   RHINITIS, ALLERGIC 03/31/2006   Past Medical History:  Diagnosis Date   Asthma    Diabetes mellitus without complication (HCC)    Family History  Problem Relation Age of Onset   Asthma Mother    Hypertension Father    Diabetes Father    Past Surgical History:  Procedure Laterality Date   ABDOMINAL HYSTERECTOMY     CHOLECYSTECTOMY     Social History   Occupational History   Not on file  Tobacco Use    Smoking status: Every Day   Smokeless tobacco: Never  Substance and Sexual Activity   Alcohol use: Yes   Drug use: Not on file   Sexual activity: Not on file    Seif Teichert Estela) Arlinda, M.D. [facility], Hand Surgery

## 2023-10-25 ENCOUNTER — Ambulatory Visit: Admitting: Orthopedic Surgery

## 2023-11-12 ENCOUNTER — Other Ambulatory Visit: Payer: Self-pay

## 2023-11-12 ENCOUNTER — Ambulatory Visit (HOSPITAL_COMMUNITY): Admission: EM | Admit: 2023-11-12 | Discharge: 2023-11-12 | Disposition: A | Discharge status: Home / Self Care

## 2023-11-12 ENCOUNTER — Encounter (HOSPITAL_COMMUNITY): Payer: Self-pay | Admitting: Emergency Medicine

## 2023-11-12 DIAGNOSIS — L237 Allergic contact dermatitis due to plants, except food: Secondary | ICD-10-CM

## 2023-11-12 MED ORDER — PREDNISONE 20 MG PO TABS: ORAL_TABLET | ORAL | 0 refills | Status: AC

## 2023-11-12 MED ORDER — DEXAMETHASONE SOD PHOSPHATE PF 10 MG/ML IJ SOLN
10.0000 mg | Freq: Once | INTRAMUSCULAR | Status: AC
  Administered 2023-11-12: 10 mg via INTRAMUSCULAR

## 2023-11-12 MED ORDER — FEXOFENADINE HCL 180 MG PO TABS: 180.0000 mg | ORAL_TABLET | Freq: Every day | ORAL | 0 refills | Status: AC

## 2023-11-12 NOTE — Discharge Instructions (Signed)
  1. Poison ivy dermatitis (Primary) - dexamethasone  (DECADRON ) injection 10 mg given in UC for acute allergic reaction to poison ivy. - predniSONE (DELTASONE) 20 MG tablet; Take 3 tablets (60 mg total) by mouth daily for 3 days, THEN 2 tablets (40 mg total) daily for 4 days, THEN 1 tablet (20 mg total) daily for 3 days.  Dispense: 20 tablet; Refill: 0 - fexofenadine (ALLEGRA) 180 MG tablet; Take 1 tablet (180 mg total) by mouth daily.  Dispense: 30 tablet; Refill: 0 - Continue taking prescribed fexofenadine for at least 5 days after finishing oral prednisone to prevent secondary rebound rash. -Continue to monitor symptoms for any change in severity if there is any escalation of current symptoms or development of new symptoms follow-up in ER for further evaluation and management.

## 2023-11-12 NOTE — ED Provider Notes (Signed)
 [facility]  Note:  This document was prepared using Conservation officer, historic buildings and may include unintentional dictation errors.  MRN: [id number] DOB: [birth year]  Subjective:   Destiny Richardson is a 48 y.o. female presenting for persistent erythematous, pruritic rash all over the body.  Patient reports that symptoms first started about 2 weeks ago after getting her cat out of a tree that she believes was covered in poison ivy.  Patient has been using over-the-counter medication with minimal improvement.  Patient reports that rash is spread from the initial location to cover her entire body.  Patient states that she usually gets poison ivy at least once or twice a year.  Patient states that she usually has to come in for a steroid shot and a course of steroids to fully treat symptoms.  Patient denies any shortness of breath, chest pain, weakness, dizziness, wheezing, stridor, swelling to the lips or tongue.  No current facility-administered medications for this encounter.  Current Outpatient Medications:    fexofenadine (ALLEGRA) 180 MG tablet, Take 1 tablet (180 mg total) by mouth daily., Disp: 30 tablet, Rfl: 0   predniSONE (DELTASONE) 20 MG tablet, Take 3 tablets (60 mg total) by mouth daily for 3 days, THEN 2 tablets (40 mg total) daily for 4 days, THEN 1 tablet (20 mg total) daily for 3 days., Disp: 20 tablet, Rfl: 0   No Known Allergies  Past Medical History:  Diagnosis Date   Asthma    Diabetes mellitus without complication (HCC)      Past Surgical History:  Procedure Laterality Date   ABDOMINAL HYSTERECTOMY     CHOLECYSTECTOMY      Family History  Problem Relation Age of Onset   Asthma Mother    Hypertension Father    Diabetes Father     Social History   Tobacco Use   Smoking status: Every Day   Smokeless tobacco: Never  Substance Use Topics   Alcohol use: Yes    ROS Refer to HPI for ROS details.  Objective:    Vitals: BP 126/79 (BP  Location: Right Arm)   Pulse 76   Temp 98.2 F (36.8 C) (Oral)   Resp 18   SpO2 98%   Physical Exam Vitals and nursing note reviewed.  Constitutional:      General: She is not in acute distress.    Appearance: Normal appearance. She is not ill-appearing.  HENT:     Head: Normocephalic.  Cardiovascular:     Rate and Rhythm: Normal rate.  Pulmonary:     Effort: Pulmonary effort is normal. No respiratory distress.  Skin:    General: Skin is warm and dry.     Capillary Refill: Capillary refill takes less than 2 seconds.     Findings: Erythema and rash (Rash characteristics and presentation consistent with poison ivy dermatitis.) present. No abscess, bruising or wound. Rash is papular and urticarial.  Neurological:     General: No focal deficit present.     Mental Status: She is alert and oriented to person, place, and time.  Psychiatric:        Mood and Affect: Mood normal.        Behavior: Behavior normal.     Procedures  No results found for this or any previous visit (from the past 24 hours).  Assessment and Plan :     Discharge Instructions       1. Poison ivy dermatitis (Primary) - dexamethasone  (DECADRON ) injection 10 mg given in  [facility] for acute allergic reaction to poison ivy. - predniSONE (DELTASONE) 20 MG tablet; Take 3 tablets (60 mg total) by mouth daily for 3 days, THEN 2 tablets (40 mg total) daily for 4 days, THEN 1 tablet (20 mg total) daily for 3 days.  Dispense: 20 tablet; Refill: 0 - fexofenadine (ALLEGRA) 180 MG tablet; Take 1 tablet (180 mg total) by mouth daily.  Dispense: 30 tablet; Refill: 0 - Continue taking prescribed fexofenadine for at least 5 days after finishing oral prednisone to prevent secondary rebound rash. -Continue to monitor symptoms for any change in severity if there is any escalation of current symptoms or development of new symptoms follow-up in ER for further evaluation and management.      Deidrick Rainey B Bayler Gehrig   Mishon Blubaugh,  [place], [place] 11/12/23 1354

## 2023-11-12 NOTE — ED Triage Notes (Signed)
 Pt arrive with a rash all over her body from poison ivy for the past 2 weeks. Using OTC medication with not relief.

## 2023-12-05 ENCOUNTER — Encounter: Payer: Self-pay | Admitting: Radiology

## 2024-01-22 IMAGING — DX DG LUMBAR SPINE COMPLETE 4+V
5 series · 5 of 5 positions shown · non-contrast
Comparison: 05/06/2005

CLINICAL DATA: Recent fall, back pain

EXAM:
LUMBAR SPINE - COMPLETE 4+ VIEW

[l-spine ap]
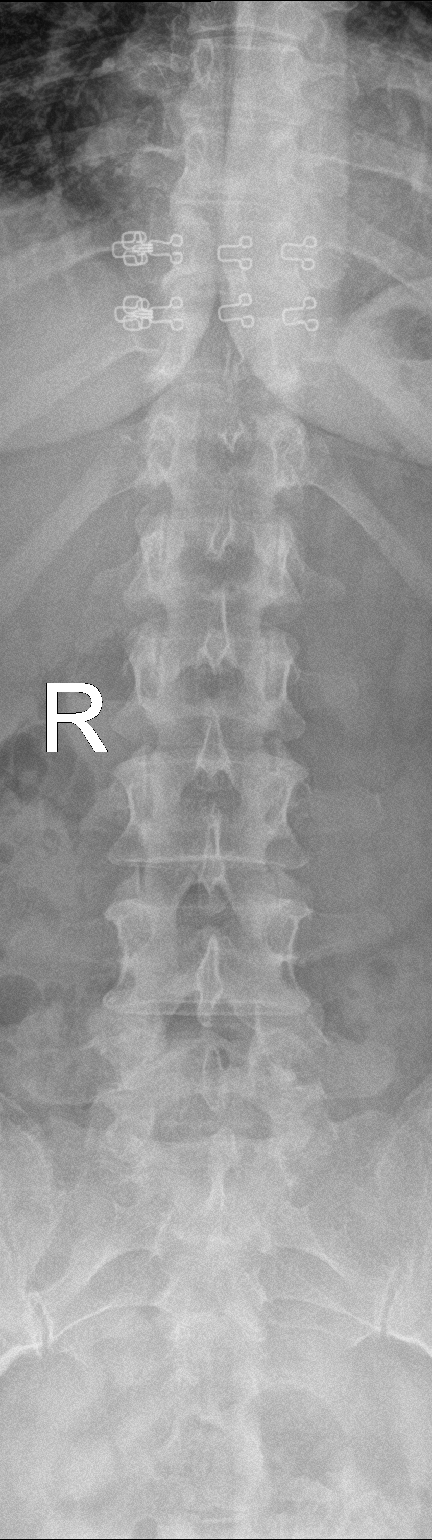

[l-spine obl (1 of 2)]
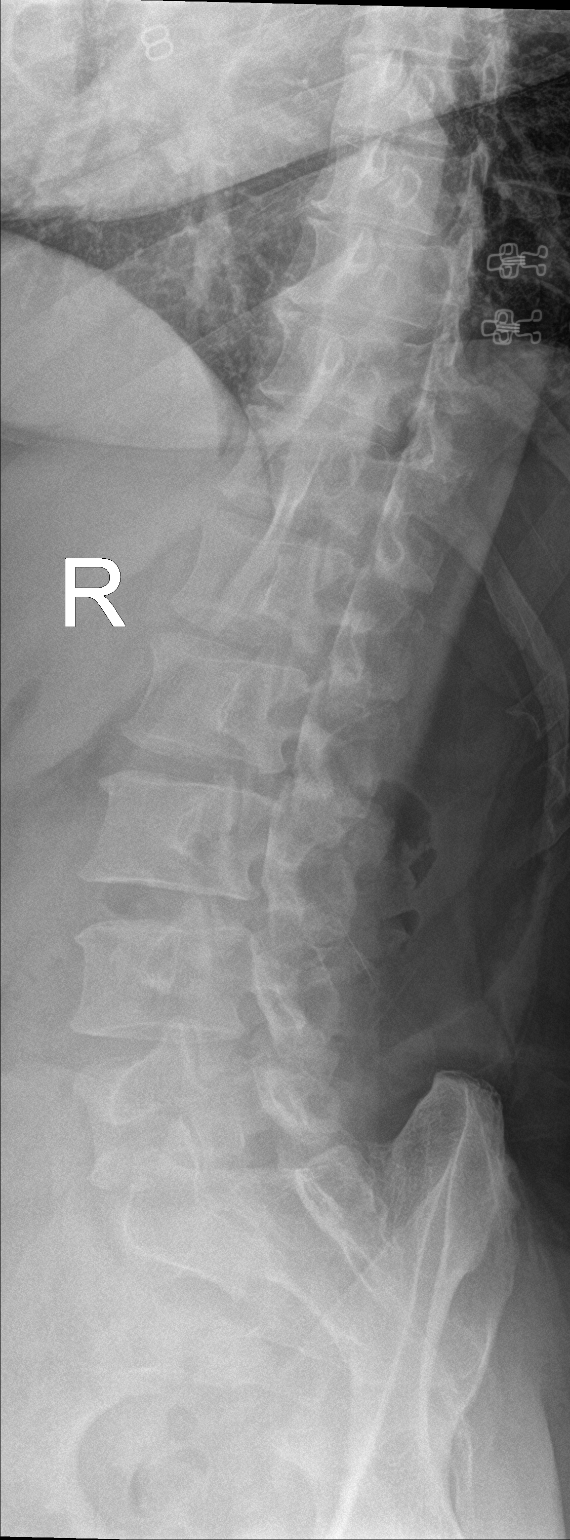

[l-spine obl (2 of 2)]
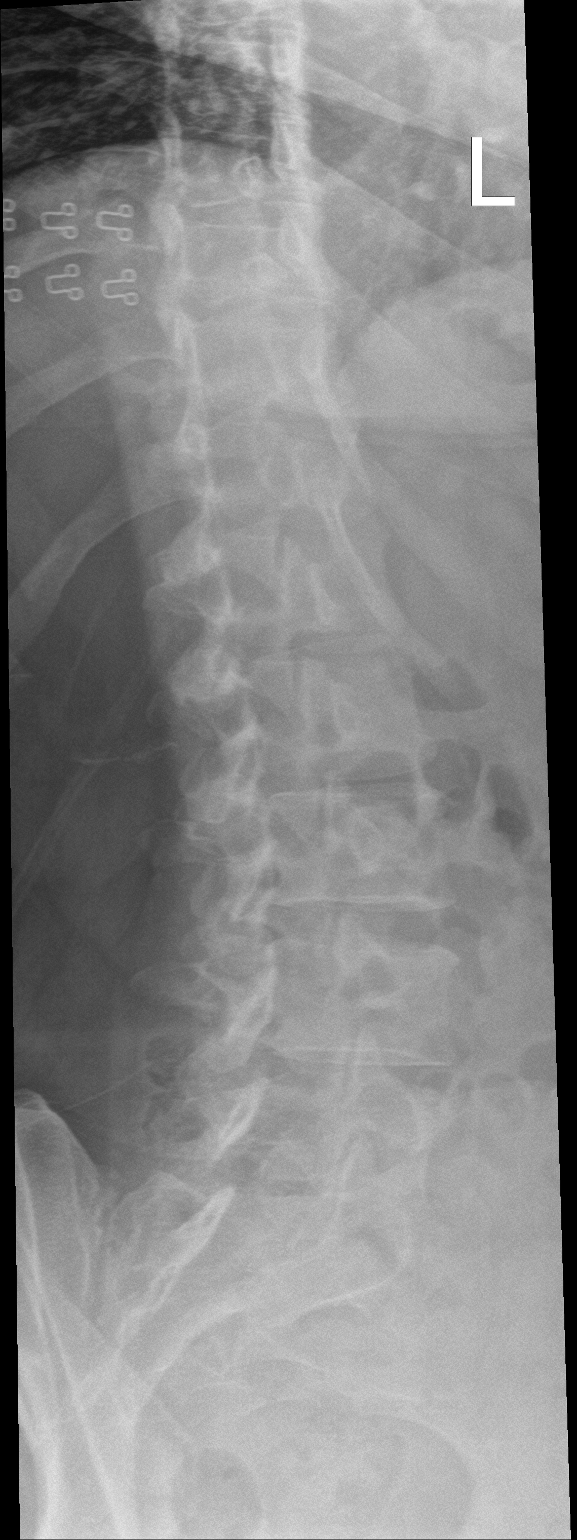

[l-spine lat (1 of 2)]
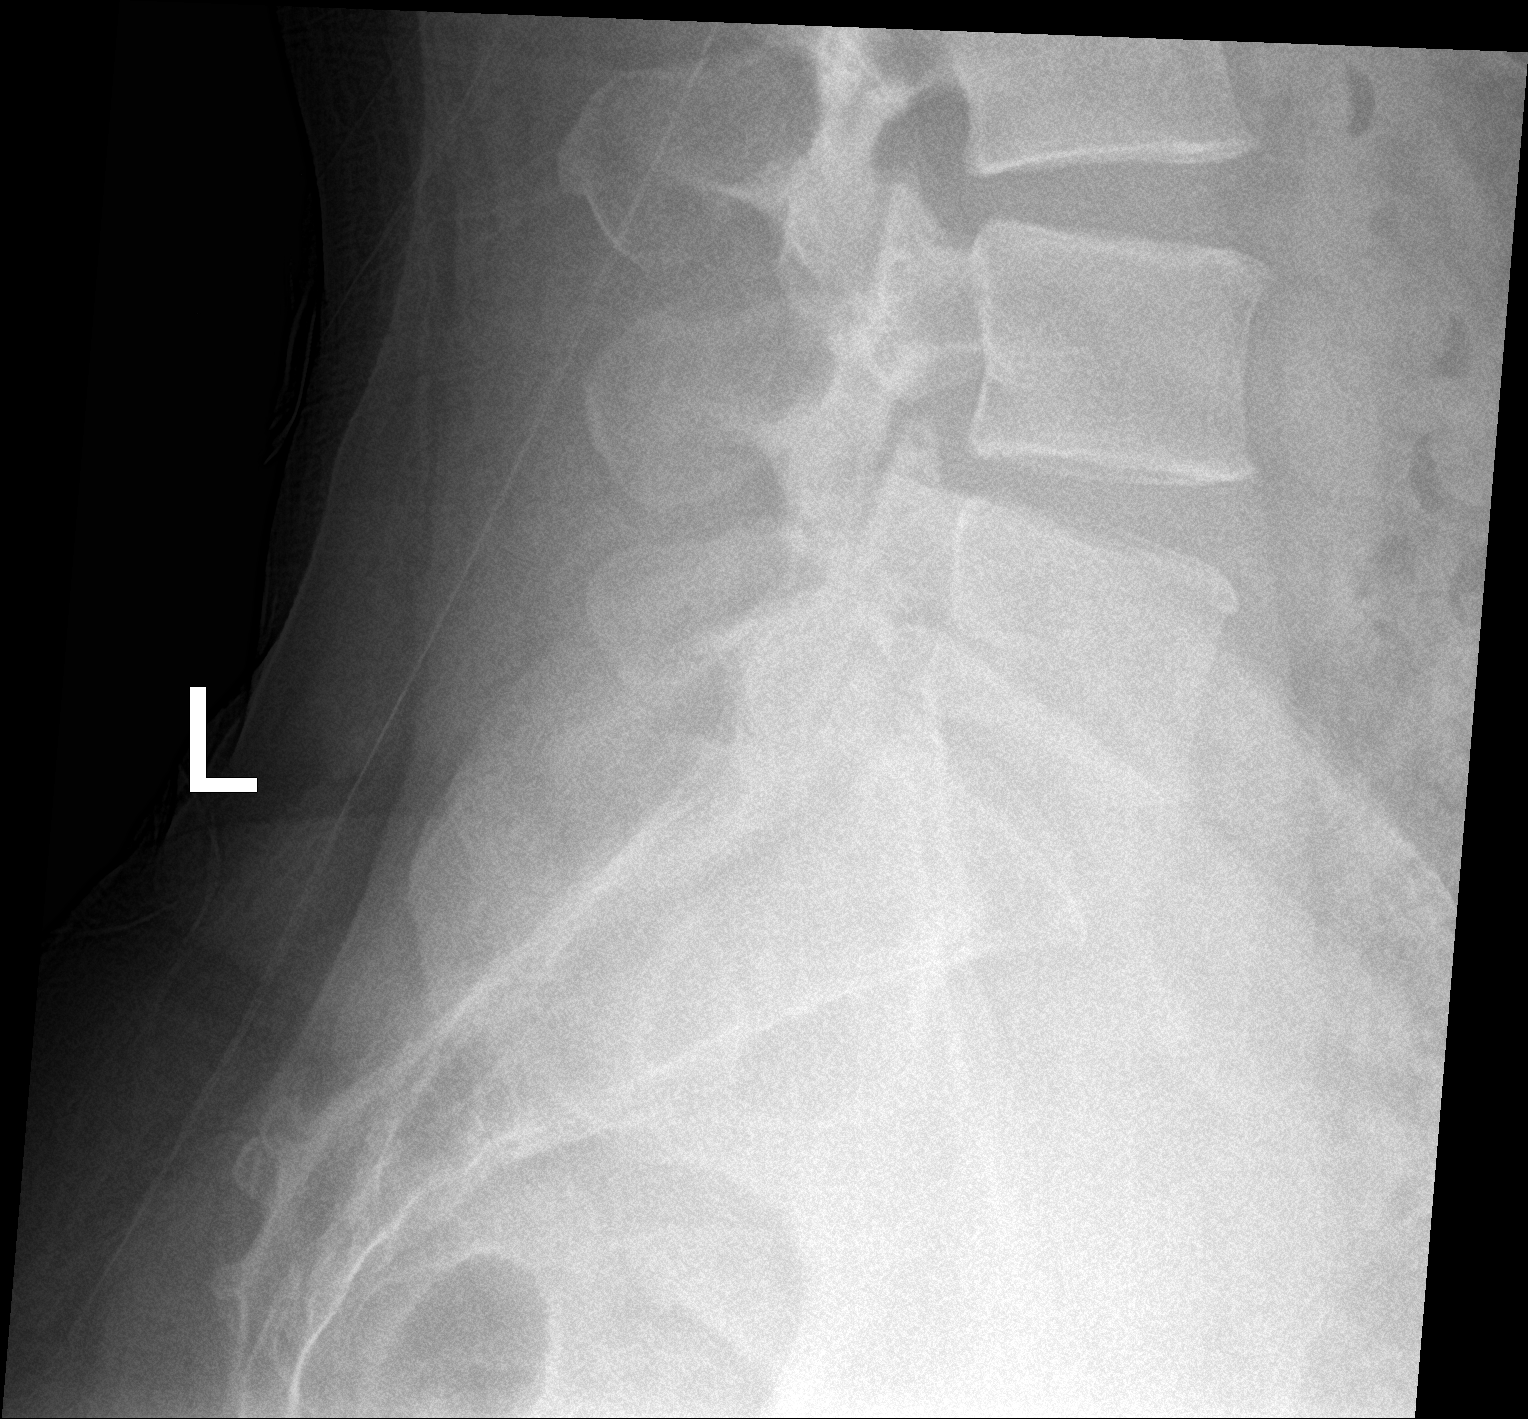

[l-spine lat (2 of 2)]
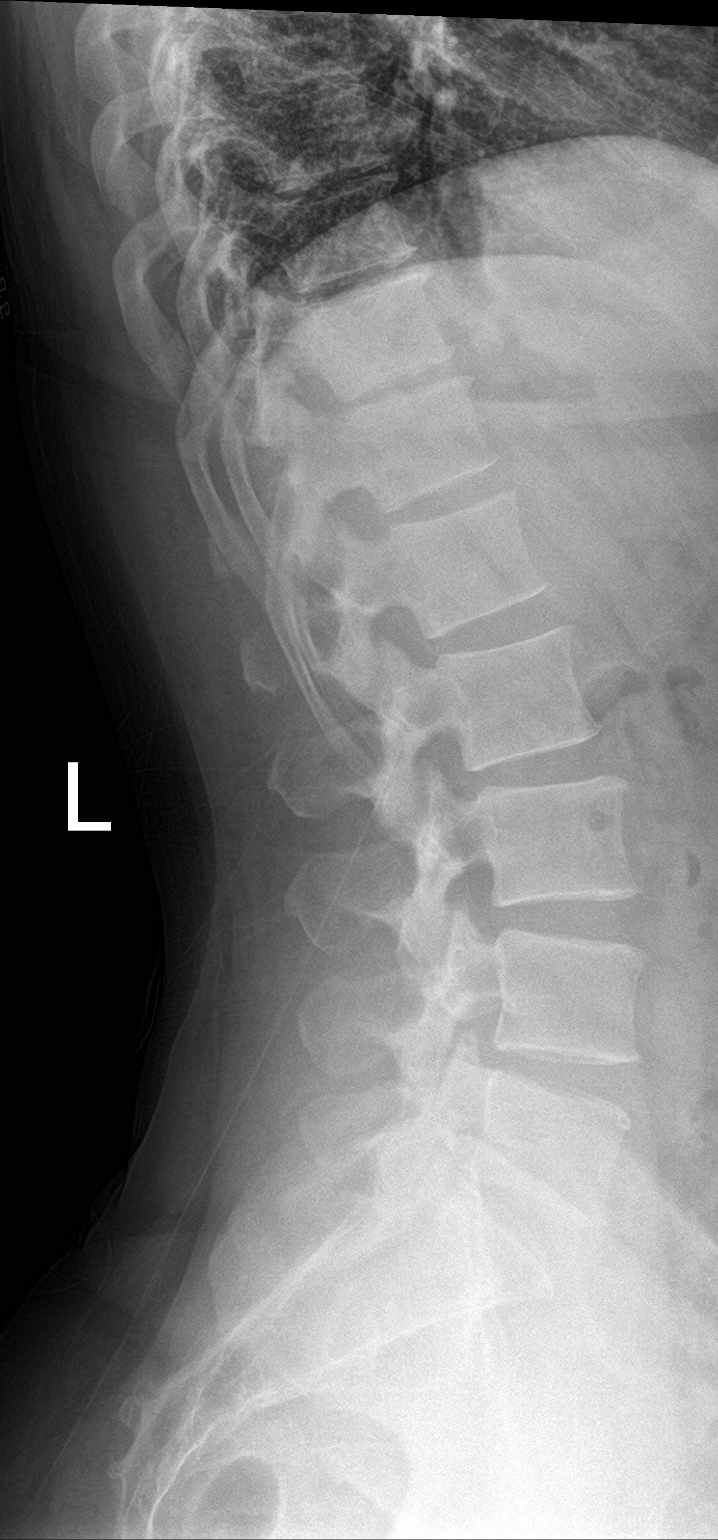

[5 of 5 positions shown; findings below may reference images not displayed]

FINDINGS: There is no evidence of lumbar spine fracture. Alignment is normal.
Intervertebral disc spaces are maintained.
IMPRESSION: No acute abnormality or significant degenerative change.

## 2024-01-22 IMAGING — DX DG HAND COMPLETE 3+V*L*
3 series · 3 of 3 positions shown · non-contrast
Comparison: None.

CLINICAL DATA: Recent fall, left thumb and middle finger pain

EXAM:
LEFT HAND - COMPLETE 3+ VIEW

[hand pa]
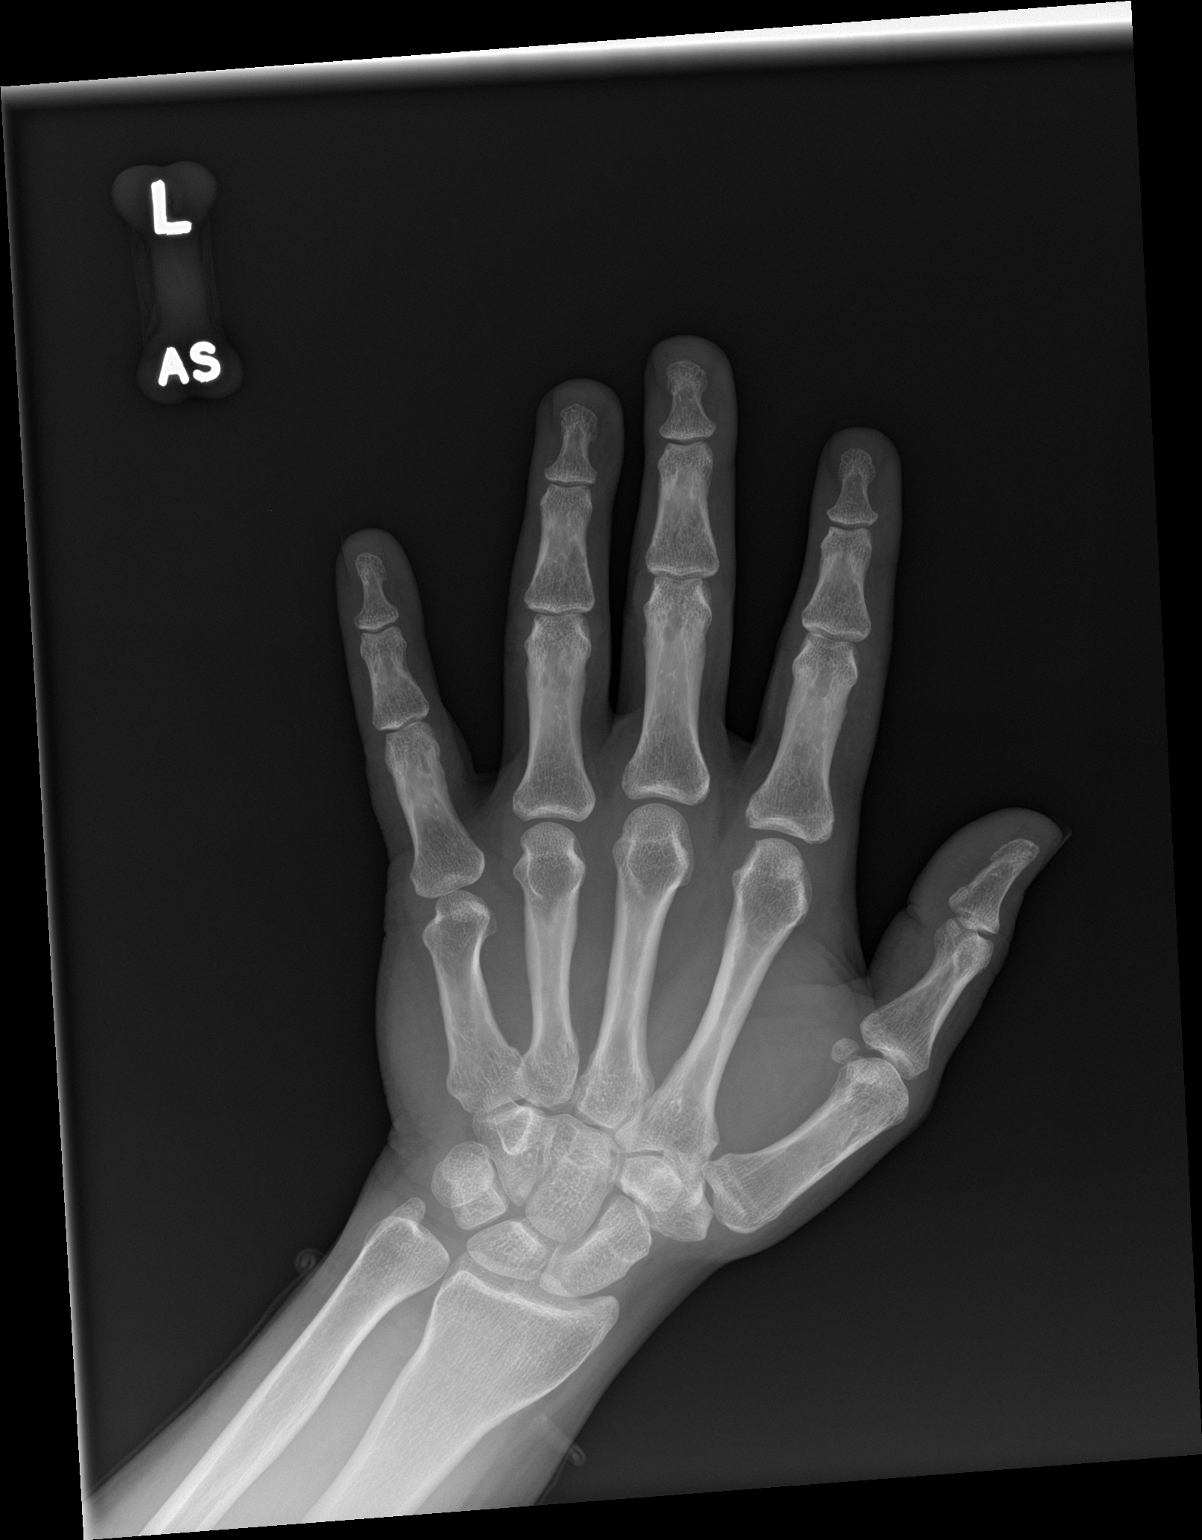

[hand obl]
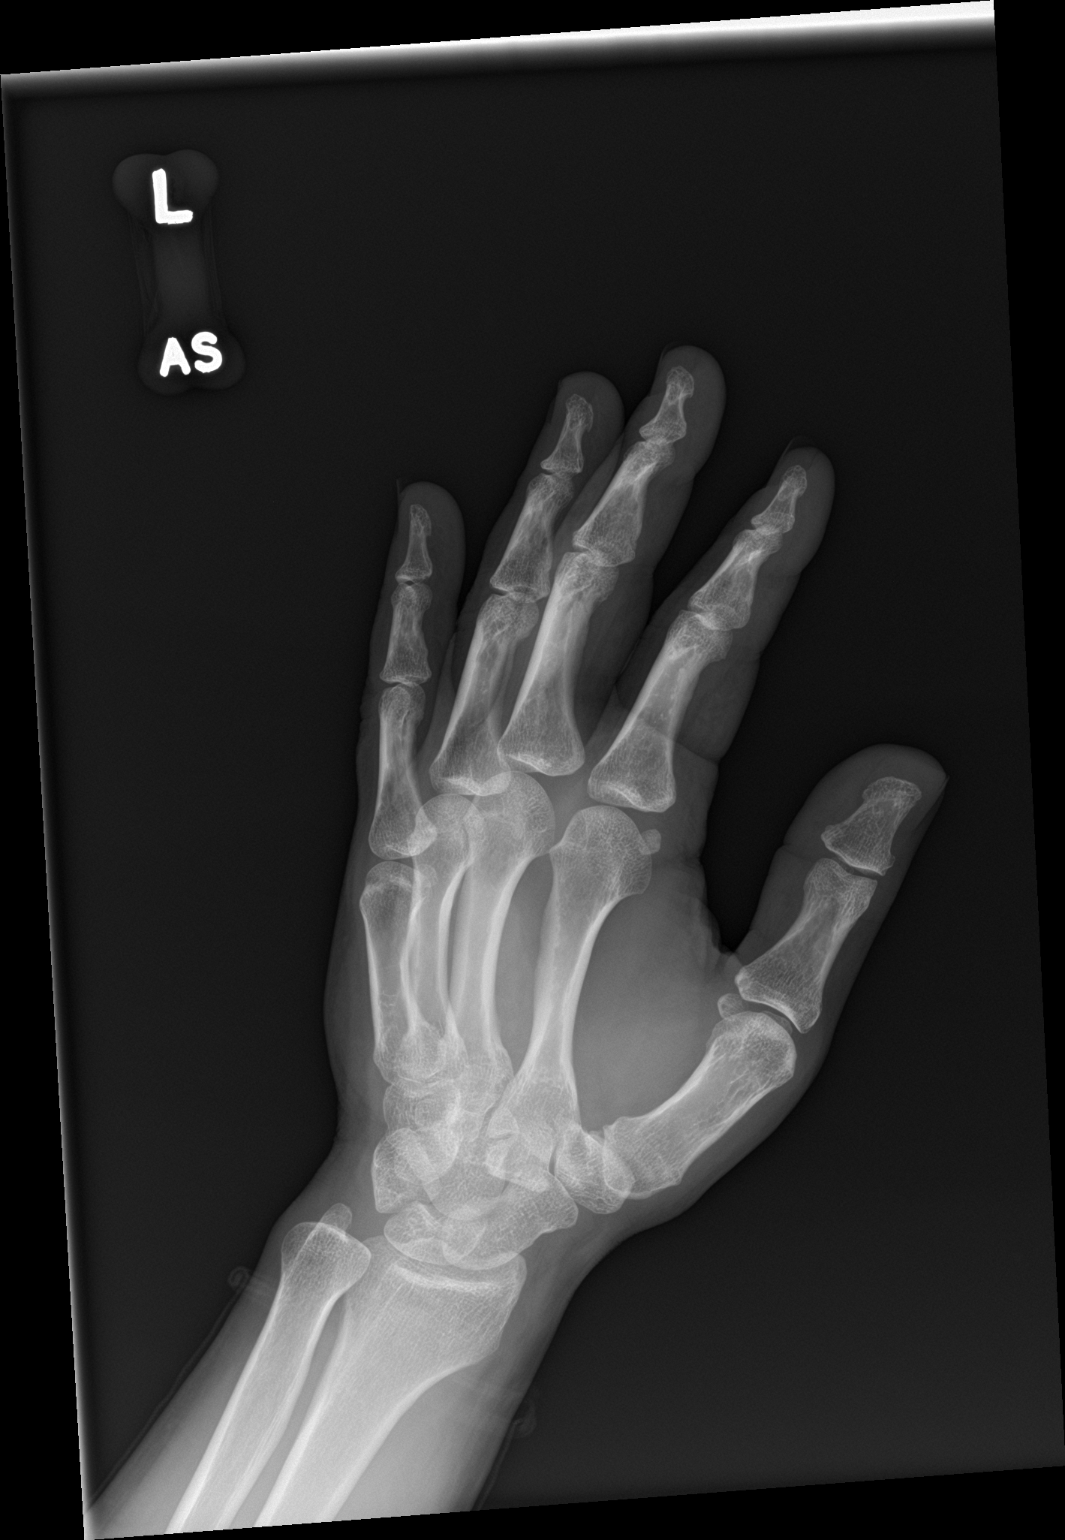

[hand lat]
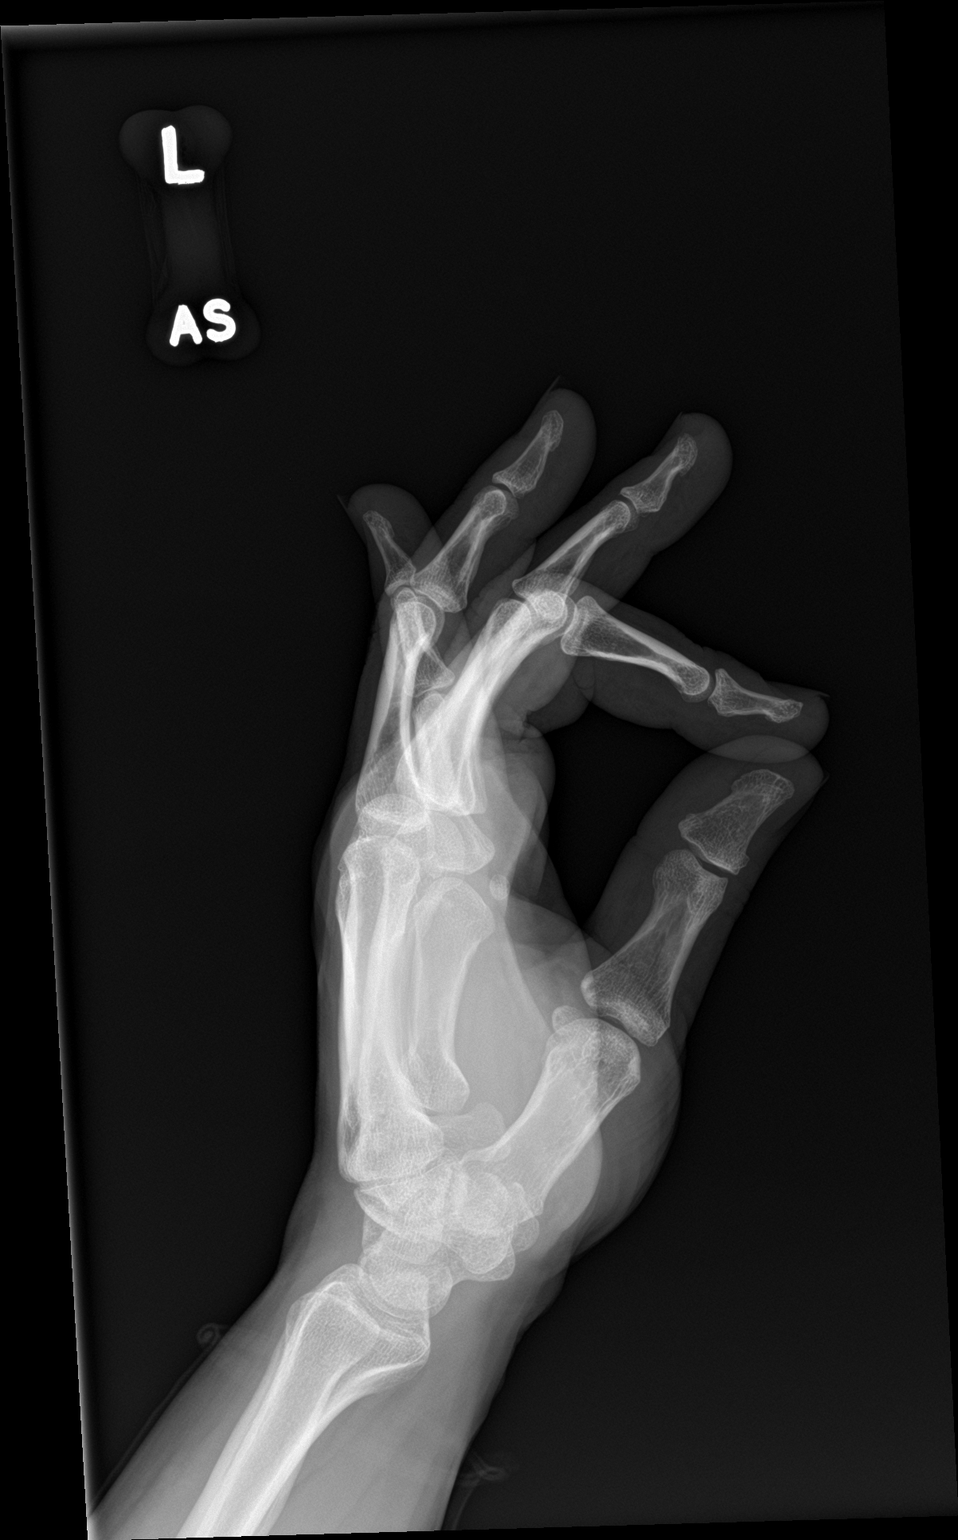

[3 of 3 positions shown; findings below may reference images not displayed]

FINDINGS: There is no evidence of fracture or dislocation. There is no
evidence of arthropathy or other focal bone abnormality. Soft
tissues are unremarkable.
IMPRESSION: Negative.

## 2024-03-19 ENCOUNTER — Encounter: Admitting: Orthopedic Surgery
